# Patient Record
Sex: Male | Born: 1965 | Marital: Single
Health system: Southern US, Community
[De-identification: ages and names within clinical notes are randomized; demographics above are authoritative.]

---

## 2008-12-11 ENCOUNTER — Emergency Department: Payer: Self-pay | Admitting: Emergency Medicine

## 2010-02-26 ENCOUNTER — Ambulatory Visit: Payer: Self-pay | Admitting: Internal Medicine

## 2010-10-17 ENCOUNTER — Emergency Department: Payer: Self-pay | Admitting: Emergency Medicine

## 2010-11-25 ENCOUNTER — Emergency Department: Payer: Self-pay | Admitting: *Deleted

## 2013-02-12 ENCOUNTER — Observation Stay: Payer: Self-pay | Admitting: Student

## 2013-02-12 DIAGNOSIS — I200 Unstable angina: Secondary | ICD-10-CM

## 2013-02-12 DIAGNOSIS — I10 Essential (primary) hypertension: Secondary | ICD-10-CM

## 2013-02-12 DIAGNOSIS — I369 Nonrheumatic tricuspid valve disorder, unspecified: Secondary | ICD-10-CM

## 2013-02-12 DIAGNOSIS — E785 Hyperlipidemia, unspecified: Secondary | ICD-10-CM

## 2013-02-12 LAB — COMPREHENSIVE METABOLIC PANEL
ALBUMIN: 3.5 g/dL (ref 3.4–5.0)
ALK PHOS: 84 U/L
ALT: 38 U/L (ref 12–78)
AST: 23 U/L (ref 15–37)
Anion Gap: 6 — ABNORMAL LOW (ref 7–16)
BUN: 16 mg/dL (ref 7–18)
Bilirubin,Total: 0.4 mg/dL (ref 0.2–1.0)
CHLORIDE: 105 mmol/L (ref 98–107)
CO2: 25 mmol/L (ref 21–32)
CREATININE: 1.04 mg/dL (ref 0.60–1.30)
Calcium, Total: 8.9 mg/dL (ref 8.5–10.1)
Glucose: 109 mg/dL — ABNORMAL HIGH (ref 65–99)
Osmolality: 274 (ref 275–301)
Potassium: 3.8 mmol/L (ref 3.5–5.1)
Sodium: 136 mmol/L (ref 136–145)
TOTAL PROTEIN: 7.6 g/dL (ref 6.4–8.2)

## 2013-02-12 LAB — CK TOTAL AND CKMB (NOT AT ARMC)
CK, TOTAL: 202 U/L (ref 35–232)
CK, Total: 159 U/L (ref 35–232)
CK, Total: 149 U/L (ref 35–232)
CK-MB: 1.3 ng/mL (ref 0.5–3.6)
CK-MB: 1.3 ng/mL (ref 0.5–3.6)
CK-MB: 1.4 ng/mL (ref 0.5–3.6)

## 2013-02-12 LAB — CBC
HCT: 52.5 % — ABNORMAL HIGH (ref 40.0–52.0)
HGB: 17.5 g/dL (ref 13.0–18.0)
MCH: 26.4 pg (ref 26.0–34.0)
MCHC: 33.3 g/dL (ref 32.0–36.0)
MCV: 79 fL — ABNORMAL LOW (ref 80–100)
Platelet: 189 10*3/uL (ref 150–440)
RBC: 6.64 10*6/uL — AB (ref 4.40–5.90)
RDW: 13.9 % (ref 11.5–14.5)
WBC: 6.8 10*3/uL (ref 3.8–10.6)

## 2013-02-12 LAB — PROTIME-INR
INR: 1.1
Prothrombin Time: 13.9 secs (ref 11.5–14.7)

## 2013-02-12 LAB — TROPONIN I
Troponin-I: <0.02 ng/mL
Troponin-I: <0.02 ng/mL
Troponin-I: <0.02 ng/mL

## 2013-02-12 LAB — APTT
ACTIVATED PTT: 29.4 s (ref 23.6–35.9)
ACTIVATED PTT: 104.2 s — AB (ref 23.6–35.9)

## 2013-02-12 LAB — LIPASE, BLOOD: LIPASE: 104 U/L (ref 73–393)

## 2013-02-12 LAB — MAGNESIUM: Magnesium: 1.7 mg/dL — ABNORMAL LOW

## 2013-02-13 ENCOUNTER — Encounter: Payer: Self-pay | Admitting: Cardiovascular Disease

## 2013-02-13 DIAGNOSIS — R079 Chest pain, unspecified: Secondary | ICD-10-CM

## 2013-02-13 DIAGNOSIS — I479 Paroxysmal tachycardia, unspecified: Secondary | ICD-10-CM

## 2013-02-13 DIAGNOSIS — I200 Unstable angina: Secondary | ICD-10-CM

## 2013-02-13 LAB — LIPID PANEL
CHOLESTEROL: 129 mg/dL (ref 0–200)
HDL: 23 mg/dL — AB (ref 40–60)
Ldl Cholesterol, Calc: 45 mg/dL (ref 0–100)
Triglycerides: 303 mg/dL — ABNORMAL HIGH (ref 0–200)
VLDL Cholesterol, Calc: 61 mg/dL — ABNORMAL HIGH (ref 5–40)

## 2013-02-13 LAB — BASIC METABOLIC PANEL
Anion Gap: 4 — ABNORMAL LOW (ref 7–16)
BUN: 16 mg/dL (ref 7–18)
CREATININE: 1.1 mg/dL (ref 0.60–1.30)
Calcium, Total: 8.5 mg/dL (ref 8.5–10.1)
Chloride: 102 mmol/L (ref 98–107)
Co2: 28 mmol/L (ref 21–32)
EGFR (African American): >60
Glucose: 103 mg/dL — ABNORMAL HIGH (ref 65–99)
Osmolality: 270 (ref 275–301)
Potassium: 3.9 mmol/L (ref 3.5–5.1)
SODIUM: 134 mmol/L — AB (ref 136–145)

## 2013-02-13 LAB — MAGNESIUM: Magnesium: 1.8 mg/dL

## 2013-02-13 LAB — APTT: Activated PTT: 100.7 secs — ABNORMAL HIGH (ref 23.6–35.9)

## 2013-02-13 LAB — HEMOGLOBIN A1C: Hemoglobin A1C: 5.7 % (ref 4.2–6.3)

## 2013-02-13 LAB — TSH: Thyroid Stimulating Horm: 2.68 u[IU]/mL

## 2014-05-31 NOTE — Consult Note (Signed)
General Aspect John Chavez is a 49yo Hispanic, English-speaking male w/ PMHx s/f CAD, HTN, HLD, family history of premature CAD and obesity who was admitted to [facility] today with unstable angina.   Speaking to the patient, he describes undergoing a cardiac catheterzation in 2007 after experiencing left-sided chest pressure. Cath did reveal a CAD, but PCI was not performed. According to the patient, the lesion dissolved prior to PCI. This was done at [facility]. He does not follow a cardiologist or PCP. His mother, father and sisters all had MIs in their 81-50s. He reports a history of high cholesterol. He had been on amlodipine for HTN, but has been without this medication for months.   He reports experiencing intermittent left-sided chest pressure occurring in icreased frequency and severity over the past 3 days. He thought this was indigestion at first, but the episodes reminded him of his prior chest pain leading to cath. There is no clear exertional component. Episodes would occur at rest, and awoke him from sleep last night. The discomfort became more severe (7/10) with associated dyspnea, diaphoresis and palpitations. His ex-wife drove him to the ED. He endorses DOE/SOB during this time. He denies PND, orthopnea, LE edema, palpitations, lightheadedness or syncope.   Present Illness In the ED, EKG revealed NSR with Q waves in III, aVF. No ST/T changes. Initial TnI returned WNL. He received a full-dose ASA and NTG paste with improvement of chest pain to 2/10. CXR- no acute process. BMP and CBC were largely unremarkable aside from a mildly reduced MCV at 79. He was heparinized and admitted to the medicine service.  PAST MEDICAL HISTORY Hypertension Hyperlipidemia Obesity   PAST SURGICAL HISTORY T12/L1 fusion  SOCIAL HISTORY No tobacco, EtOH or illicit drug use. Divorced, 2 children, 13 and 66. Healthy. Lives in [place], [place]. 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   FAMILY HISTORY Mother with MI in 17s, father  with MI in 40s, sisters with MIs in 40-50s  ALLERGIES None  MEDICATIONS None- previously on amlodipine   Physical Exam:  GEN no acute distress, obese   HEENT pink conjunctivae, PERRL, hearing intact to voice   NECK supple  No masses  trachea midline  no JVD or bruits   RESP normal resp effort  clear BS  no use of accessory muscles   CARD Regular rate and rhythm  Normal, S1, S2  No murmur   ABD denies tenderness  soft  normal BS  distention of central adiposity   EXTR negative cyanosis/clubbing, negative edema   SKIN normal to palpation, skin turgor good   NEURO follows commands, motor/sensory function intact   PSYCH alert, A+O to time, place, person   Review of Systems:  Subjective/Chief Complaint chest pain, nausea, sweats   General: No Complaints    Skin: No Complaints    ENT: No Complaints    Eyes: No Complaints    Neck: No Complaints    Respiratory: Short of breath   Cardiovascular: Chest pain or discomfort  Palpitations   Gastrointestinal: No Complaints    Genitourinary: No Complaints    Vascular: No Complaints    Musculoskeletal: No Complaints    Neurologic: No Complaints    Hematologic: No Complaints    Endocrine: No Complaints    Psychiatric: No Complaints    Review of Systems: All other systems were reviewed and found to be negative   Medications/Allergies Reviewed Medications/Allergies reviewed    Lab Results:  Hepatic:  06-Jan-15 08:57   Bilirubin, Total 0.4  Alkaline Phosphatase  84 (45-117 NOTE: New Reference Range 12/28/12)  SGPT (ALT) 38  SGOT (AST) 23  Total Protein, Serum 7.6  Albumin, Serum 3.5  Routine Chem:  06-Jan-15 08:57   Glucose, Serum  109  BUN 16  Creatinine (comp) 1.04  Sodium, Serum 136  Potassium, Serum 3.8  Chloride, Serum 105  CO2, Serum 25  Calcium (Total), Serum 8.9  Osmolality (calc) 274  eGFR (African American) >60  eGFR (Non-African American) >60 (eGFR values <30m/min/1.73 m2 may be an  indication of chronic kidney disease (CKD). Calculated eGFR is useful in patients with stable renal function. The eGFR calculation will not be reliable in acutely ill patients when serum creatinine is changing rapidly. It is not useful in  patients on dialysis. The eGFR calculation may not be applicable to patients at the low and high extremes of body sizes, pregnant women, and vegetarians.)  Anion Gap  6  Lipase 104 (Result(s) reported on 12 Feb 2013 at 09:37AM.)  Magnesium, Serum  1.7 (1.8-2.4 THERAPEUTIC RANGE: 4-7 mg/dL TOXIC: > 10 mg/dL  -----------------------)  Cardiac:  06-Jan-15 08:57   Troponin I < 0.02 (0.00-0.05 0.05 ng/mL or less: NEGATIVE  Repeat testing in 3-6 hrs  if clinically indicated. >0.05 ng/mL: POTENTIAL  MYOCARDIAL INJURY. Repeat  testing in 3-6 hrs if  clinically indicated. NOTE: An increase or decrease  of 30% or more on serial  testing suggests a  clinically important change)  Routine Coag:  06-Jan-15 08:57   Activated PTT (APTT) 29.4 (A HCT value >55% may artifactually increase the APTT. In one study, the increase was an average of 19%. Reference: "Effect on Routine and Special Coagulation Testing Values of Citrate Anticoagulant Adjustment in Patients with High HCT Values." American Journal of Clinical Pathology 2006;126:400-405.)  Prothrombin 13.9  INR 1.1 (INR reference interval applies to patients on anticoagulant therapy. A single INR therapeutic range for coumarins is not optimal for all indications; however, the suggested range for most indications is 2.0 - 3.0. Exceptions to the INR Reference Range may include: Prosthetic heart valves, acute myocardial infarction, prevention of myocardial infarction, and combinations of aspirin and anticoagulant. The need for a higher or lower target INR must be assessed individually. Reference: The Pharmacology and Management of the Vitamin K  antagonists: the seventh ACCP Conference on Antithrombotic  and Thrombolytic Therapy. CHUTML.4650Sept:126 (3suppl): [id number] A HCT value >55% may artifactually increase the PT.  In one study,  the increase was an average of 25%. Reference:  "Effect on Routine and Special Coagulation Testing Values of Citrate Anticoagulant Adjustment in Patients with High HCT Values." American Journal of Clinical Pathology 2006;126:400-405.)  Routine Hem:  06-Jan-15 08:57   WBC (CBC) 6.8  RBC (CBC)  6.64  Hemoglobin (CBC) 17.5  Hematocrit (CBC)  52.5  Platelet Count (CBC) 189 (Result(s) reported on 12 Feb 2013 at 09:36AM.)  MCV  79  MCH 26.4  MCHC 33.3  RDW 13.9   EKG:  Interpretation EKG shows NSR, Q waves III, aVF, no ST/T changes    No Known Allergies:   Vital Signs/Nurse's Notes: **Vital Signs.:   06-Jan-15 12:23  Vital Signs Type Post Medication  Systolic BP Systolic BP 1354 Diastolic BP (mmHg) Diastolic BP (mmHg) 87  Mean BP 108    Impression 89yoHispanic, English-speaking male w/ PMHx s/f CAD, HTN, HLD, family history of premature CAD and obesity [facility]   1. Unstable angina The patient has a history of cardiac catheterization with  appreciable CAD. There was apparently a flow-limiting lesion with ? plaque rupture downstream. No PCI performed. He is not followed by a cardiologist orPCP. Cardiac RFs including HTN, HLD, obesity have gone unmanaged for several years. He reports accelerating angina over the past 3 days. 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 No acute ST/T changes suggestive of ischemia. Initial TnI WNL. The patient is at a high pretest probability given several cardiac RFs outlined below, HPI concerning for unstable angina and chest pain responsive to NTG. Will plan to pursue diagnostic cardiac catheterization tomorrow. Currently heparinized. Chest pain 2/10 on NTG paste.  -- Resume heparin -- Continue to cycle troponins -- Review 2D echo -- Low-dose ASA, BB, high  potency statin, NTG SL PRN -- Risk stratify with lipid panel, Hgb A1C -- Check TSH -- Cath tomorrow, 1:30 pm   2. Hypertension BP better controlled since admission.  -- Favor adding carvedilol with its alpha activitiy for BP control  -- Consider ACEi/ARB as next agents should further BP control be needed  3. Hyperlipidemia Check lipid panel -- Start high potency statin  4. Obesity -- Exercise & dietary changes as a means of weight loss and RF reduction   Electronic Signatures: Meriel Pica (PA-C)  (Signed 06-Jan-15 12:57)  Authored: General Aspect/Present Illness, History and Physical Exam, Review of System, Home Medications, Labs, EKG , Allergies, Vital Signs/Nurse's Notes, Impression/Plan Ida Rogue (MD)  (Signed 06-Jan-15 20:06)  Authored: General Aspect/Present Illness, Review of System, EKG , Impression/Plan  Co-Signer: General Aspect/Present Illness, History and Physical Exam, Review of System, Home Medications, Labs, EKG , Allergies, Vital Signs/Nurse's Notes, Impression/Plan   Last Updated: 06-Jan-15 20:06 by Ida Rogue (MD)

## 2014-05-31 NOTE — H&P (Signed)
PATIENT NAME:  John Chavez, John Chavez MR#:  [id number] DATE OF BIRTH:  04-15-1965  DATE OF ADMISSION:  02/12/2013  PRIMARY CARE PHYSICIAN: Dr. Zada Finders at [facility] in [place]  REFERRING PHYSICIAN: Dr. York Cerise  CHIEF COMPLAINT: Chest pain.   HISTORY OF PRESENT ILLNESS: The patient is a 49 year old male with history of hypertension and hyperlipidemia with possible MI a few years ago who had a cath but was told that the clot "disintegrated." He is supposed to be on blood pressure medications, but at one time was on amlodipine and has not been on it for 8 years because of noncompliance issues. He has been having off and on chest pain with radiation to the left arm, nausea for the past 3 days. They have been progressive and more frequent. They usually could last anywhere from minutes to a half hour or so. As they have been getting progressive he came into the hospital. He had an episode of diaphoresis yesterday. He has been having nausea. He has no chest pain now. He has had multiple episodes of fluttering in his chest as well which do not last long but is bothersome for the patient. He came into the hospital. He has a negative troponin. Hospitalist services were contacted for further evaluation and management.   PAST MEDICAL HISTORY:  1.  MI possibly.  2.  Hypertension.  3.  Hyperlipidemia.   MEDICATIONS: None.  ALLERGIES: None.  SOCIAL HISTORY: No tobacco, alcohol or drug use. Drives a propane truck.   FAMILY HISTORY: Dad died from MI at age 1. Mom had a couple MIs. Two sisters have MIs.   REVIEW OF SYSTEMS: CONSTITUTIONAL: No fever, chills, weight changes.  EYES: No blurry vision or double vision.  ENT: No tinnitus or hearing loss. No thyroid issues. CARDIOVASCULAR: The has had off-and-on chest pain as above. No chest pain now. History of hypertension.  RESPIRATORY: No cough, wheezing, shortness of breath or dyspnea on exertion, but he does get short of breath. He gets dizzy when he lies  flat. GASTROINTESTINAL: Nausea without vomiting. No abdominal pain or black or tarry stools.  GENITOURINARY: Denies dysuria or hematuria.  HEME AND LYMPH: No anemia or easy bruising.  SKIN: No rashes.  MUSCULOSKELETAL: Denies arthritis or gout.  NEUROLOGIC: Denies focal weakness or numbness.  PSYCHIATRIC: Denies anxiety or insomnia.   PHYSICAL EXAMINATION: VITAL SIGNS: Temperature on arrival noted to be 98, pulse rate 87, respiratory rate 20, blood pressure 164/96, and O2 sat 93% on room air.  GENERAL: The patient is an obese male lying in bed, in no obvious distress, talking in full sentences.  HEENT: Normocephalic, atraumatic. Pupils are equal and reactive. Anicteric sclerae. Moist mucous membranes.  NECK: Supple. No thyroid tenderness. No cervical lymphadenopathy.  CARDIOVASCULAR: S1 and S2 regularly irregular. No significant murmurs appreciated.  LUNGS: Clear without wheezing, rhonchi or rales.  ABDOMEN: Soft, nontender, nondistended. Positive bowel sounds in all quadrants.  EXTREMITIES: No pitting edema.  NEUROLOGIC: Cranial nerves II through XII grossly intact. Strength is 5 out of 5 in all extremities. Sensation is intact to light touch.  PSYCHIATRIC: Awake, alert and oriented x3.  LABORATORY AND DIAGNOSTICS: EKG: Rate is 76, normal sinus rhythm. There are some Q waves in inferior leads. No acute ST elevations or depressions.   X-ray of the chest: No acute disease.   Troponin negative. Glucose 109, BUN 16, creatinine 1.04, sodium 136, potassium 3.8. LFTs within normal limits. Lipase 104. WBC 6.8, hemoglobin 17.5, and platelets are 189.  INR is 1.1.  ASSESSMENT AND PLAN: We have a pleasant 49 year old male with hypertension, hyperlipidemia, history of possible coronary artery disease versus myocardial infarction, and noncompliant with medications who comes in with likely unstable angina. Given the multiple risk factors and not compliant with medications and strong family history we  would go ahead and admit the patient, start him on heparin drip, give him aspirin, start him on a statin and a beta blocker. He has no active chest pain now. However, I would think he probably needs a cardiac catheterization. I will discuss the case with cardiologist. Would start him on a nitro patch, trend the troponins, and obtain an echocardiogram as well. Would check a lipid profile as well as TSH and Hemoglobin A1c for risk stratification. He will be admitted to telemetry with remote monitoring. For deep vein thrombosis prophylaxis, he would be on heparin drip. For his history of hyperlipidemia, we would start him on a statin. The patient is FULL code.   TOTAL TIME SPENT: 45 minutes. ____________________________ Krystal EatonShayiq Rami Waddle, MD sa:sb D: 02/12/2013 11:06:42 ET T: 02/12/2013 11:25:52 ET JOB#: [id number]  cc: Krystal EatonShayiq Lily Kernen, MD, <Dictator> Dione HousekeeperMario Ernesto Olmedo, MD Krystal EatonSHAYIQ Chaniyah Jahr MD ELECTRONICALLY SIGNED 02/19/2013 11:33

## 2014-05-31 NOTE — Discharge Summary (Signed)
PATIENT NAME:  John Chavez, John Chavez MR#:  [id number] DATE OF BIRTH:  February 04, 1966  DATE OF ADMISSION:  02/12/2013 DATE OF DISCHARGE:  02/13/2013  CONSULTANT: Dr. Mariah MillingGollan from cardiology.   CHIEF COMPLAINT: Chest pain.   DISCHARGE DIAGNOSES: 1.  Chest pain, likely cardiac with cardiac catheterization showing no significant coronary artery disease.  2.  Hypertension.  3.  Hypertriglyceridemia.   DISCHARGE MEDICATIONS: Aspirin 81 mg daily, metoprolol 25 mg 2 times a day.   DIET: Low-fat, low-sodium, low-cholesterol.  ACTIVITY: As tolerated.   FOLLOWUP: Please follow with cardiologist within 1 to 2 weeks. Please follow with PCP within 1 to 2 weeks.   DISPOSITION: Home.   SIGNIFICANT LABS AND IMAGING: Troponins were negative x 3. Initial white count 6.8, hemoglobin 17.5, platelets 189. TSH 2.68. Triglycerides 303 with LDL of 45. Cardiac cath showing no significant CAD, normal EF with some frequent PVCs.   HISTORY OF PRESENT ILLNESS AND HOSPITAL COURSE: For full details of H and P, please see the dictation on January 6th by Dr. Jacques NavyAhmadzia but, briefly, this is a 49 year old male with history of hypertension, hyperlipidemia, not on any medications, who came in for chest pain with very typical features of radiation, nausea and some dizziness. Given the history of hypertension, hyperlipidemia and possible MI in the past for which he underwent a cardiac cath, he was admitted to the hospitalist service with heparin drip, aspirin a beta blocker. Cardiology was consulted. He did rule out for acute coronary syndrome with cyclic cardiac markers and ultimately underwent a cardiac cath showing no significant CAD.  Of note, his blood pressure did improve with metoprolol; however, he did have episodes of heart rates in the 50s with 25 mg every 6 hours of metoprolol and we have changed it to 25 mg twice a day. At this point, he has no chest pains. In regards to his triglycerides, diet could be tried. His LDL is in the 40s  and he will be discharged with outpatient followup with cardiology and PCP.    PHYSICAL EXAMINATION: VITAL SIGNS: On the day of discharge, his temperature was 98.5, pulse rate 56, respiratory rate 18, blood pressure 124/78, O2 sat 97% post procedure.  GENERAL: The patient is a well-developed male lying in bed, no obvious distress.  HEENT: Normocephalic, atraumatic.  LUNGS: Clear.  CARDIAC:  Patient on auscultation of the heart sounds is a little bradycardic but no significant murmurs, rubs or gallops.  ABDOMEN: Soft, nontender.  EXTREMITIES: No pitting edema.   He will be discharged with outpatient followup with instructions to follow with his PCP and take his medications.   ____________________________ Krystal EatonShayiq Gazelle Towe, MD sa:cs D: 02/13/2013 17:11:00 ET T: 02/13/2013 20:23:03 ET JOB#: [id number]  cc: Antonieta Ibaimothy J. Gollan, MD Krystal EatonShayiq Punam Broussard, MD, <Dictator> Dione HousekeeperMario Ernesto Olmedo, MD   Krystal EatonSHAYIQ Justyna Timoney MD ELECTRONICALLY SIGNED 02/19/2013 11:33

## 2014-11-30 IMAGING — CR DG CHEST 1V PORT
1 series · 1 of 1 positions shown · non-contrast
Comparison: None.

CLINICAL DATA: Chest pain and discomfort for 3 days.

EXAM:
PORTABLE CHEST - 1 VIEW

[ap]
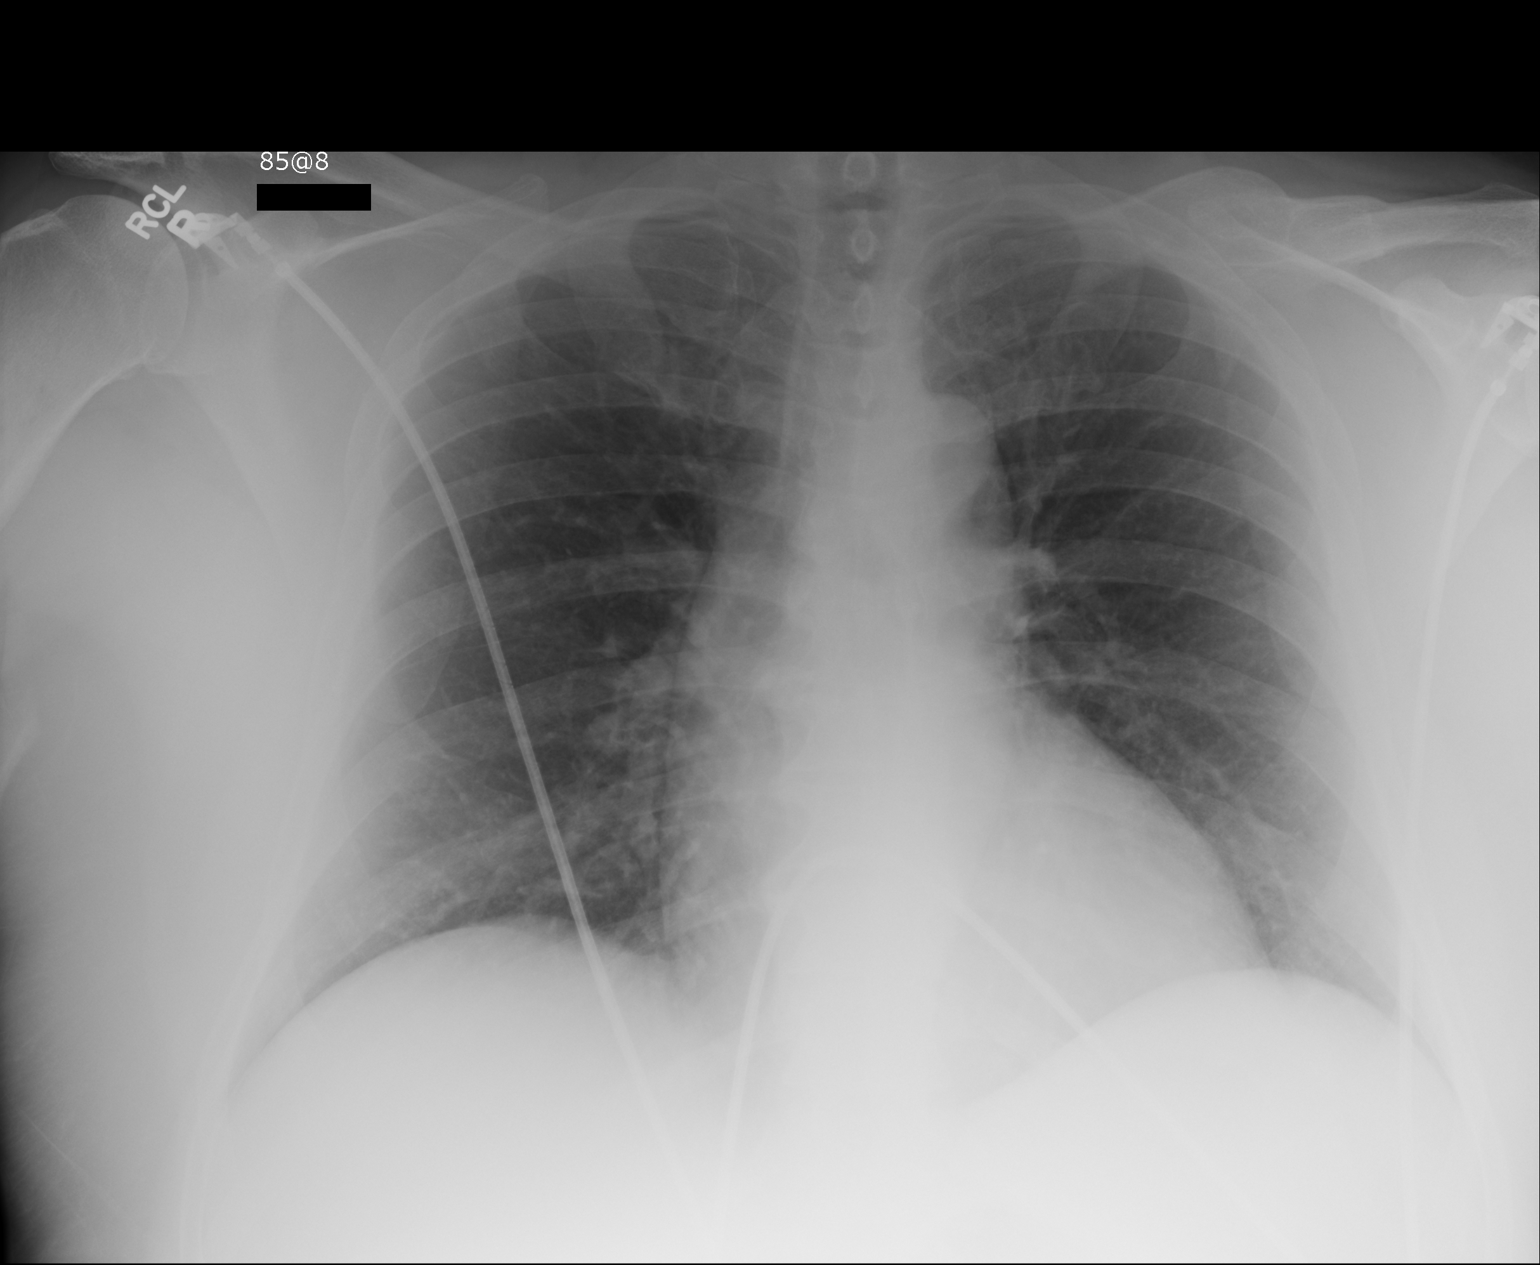

[1 of 1 positions shown; findings below may reference images not displayed]

FINDINGS: The lungs are clear. Heart size is upper normal. No pneumothorax or
pleural effusion. Remote left clavicle fracture is noted.
IMPRESSION: No acute disease.
